# Patient Record
Sex: Female | Born: 1990 | Race: White | Hispanic: No | Marital: Married | Smoking: Never smoker
Health system: Southern US, Community
[De-identification: ages and names within clinical notes are randomized; demographics above are authoritative.]

---

## 2020-09-22 ENCOUNTER — Emergency Department
Admission: EM | Admit: 2020-09-22 | Discharge: 2020-09-22 | Disposition: A | Discharge status: Home / Self Care | Payer: BC Managed Care – PPO | Source: Home / Self Care

## 2020-09-22 ENCOUNTER — Ambulatory Visit: Payer: Self-pay

## 2020-09-22 ENCOUNTER — Emergency Department (INDEPENDENT_AMBULATORY_CARE_PROVIDER_SITE_OTHER): Payer: BC Managed Care – PPO

## 2020-09-22 ENCOUNTER — Other Ambulatory Visit: Payer: Self-pay

## 2020-09-22 ENCOUNTER — Emergency Department: Admit: 2020-09-22 | Discharge status: Absent without leave | Payer: Self-pay

## 2020-09-22 DIAGNOSIS — R05 Cough: Secondary | ICD-10-CM

## 2020-09-22 DIAGNOSIS — J209 Acute bronchitis, unspecified: Secondary | ICD-10-CM | POA: Diagnosis not present

## 2020-09-22 DIAGNOSIS — Z8616 Personal history of COVID-19: Secondary | ICD-10-CM

## 2020-09-22 MED ORDER — PREDNISONE 50 MG PO TABS: 50.0000 mg | ORAL_TABLET | Freq: Every day | ORAL | 0 refills | Status: AC

## 2020-09-22 MED ORDER — DOXYCYCLINE HYCLATE 100 MG PO CAPS: 100.0000 mg | ORAL_CAPSULE | Freq: Two times a day (BID) | ORAL | 0 refills | Status: AC

## 2020-09-22 MED ORDER — BENZONATATE 100 MG PO CAPS
100.0000 mg | ORAL_CAPSULE | Freq: Three times a day (TID) | ORAL | 0 refills | Status: AC
Start: 2020-09-22 — End: ?

## 2020-09-22 NOTE — Discharge Instructions (Addendum)

## 2020-09-22 NOTE — ED Provider Notes (Signed)
[facility]    CSN: [id number] Arrival date & time: 09/22/20  1416      History   Chief Complaint Chief Complaint  Patient presents with  . Cough  . Nasal Congestion    HPI Bonnie Vang is a 29 y.o. female.   HPI Bonnie Vang is a 29 y.o. female presenting to UC with c/o 1 week of worsening dry hacking cough that is more productive in the morning. Soreness from coughin.  She was dx with COVID at the beginning of the month after developing body aches.  She was feeling better and had no cough for about a week but then current cough started 1 week ago. She was fully vaccinated with the Pfizer vaccine for COVID.  Denies fever, chills, n/v/d. Hx of asthma. Has used her inhaler without relief.    History reviewed. No pertinent past medical history.  There are no problems to display for this patient.   History reviewed. No pertinent surgical history.  OB History   No obstetric history on file.      Home Medications    Prior to Admission medications   Medication Sig Start Date End Date Taking? Authorizing Provider  spironolactone (ALDACTONE) 25 MG tablet Take 25 mg by mouth daily.   Yes [provider]  benzonatate (TESSALON) 100 MG capsule Take 1-2 capsules (100-200 mg total) by mouth every 8 (eight) hours. 09/22/20   Lurene Shadow, PA-C  doxycycline (VIBRAMYCIN) 100 MG capsule Take 1 capsule (100 mg total) by mouth 2 (two) times daily for 7 days. 09/22/20 09/29/20  Lurene Shadow, PA-C  predniSONE (DELTASONE) 50 MG tablet Take 1 tablet (50 mg total) by mouth daily with breakfast for 5 days. 09/22/20 09/27/20  Lurene Shadow, PA-C    Family History Family History  Problem Relation Age of Onset  . Hypertension Mother   . Healthy Father     Social History Social History   Tobacco Use  . Smoking status: Never Smoker  . Smokeless tobacco: Never Used  Substance Use Topics  . Alcohol use: Yes    Alcohol/week: 1.0 standard drink    Types: 1 Standard  drinks or equivalent per week    Comment: socially  . Drug use: Not on file     Allergies   Patient has no known allergies.   Review of Systems Review of Systems  Constitutional: Positive for fatigue. Negative for chills and fever.  HENT: Positive for congestion (mild). Negative for ear pain, sore throat, trouble swallowing and voice change.   Respiratory: Positive for cough and chest tightness. Negative for shortness of breath.   Cardiovascular: Negative for chest pain and palpitations.  Gastrointestinal: Negative for abdominal pain, diarrhea, nausea and vomiting.  Musculoskeletal: Negative for arthralgias, back pain and myalgias.  Skin: Negative for rash.  All other systems reviewed and are negative.    Physical Exam Triage Vital Signs ED Triage Vitals  Enc Vitals Group     BP 09/22/20 1439 119/79     Pulse Rate 09/22/20 1439 77     Resp 09/22/20 1439 16     Temp 09/22/20 1439 97.9 F (36.6 C)     Temp Source 09/22/20 1439 Oral     SpO2 09/22/20 1439 99 %     Weight --      Height --      Head Circumference --      Peak Flow --      Pain Score 09/22/20 1437 0  Pain Loc --      Pain Edu? --      Excl. in GC? --    No data found.  Updated Vital Signs BP 119/79 (BP Location: Right Arm)   Pulse 77   Temp 97.9 F (36.6 C) (Oral)   Resp 16   SpO2 99%   Visual Acuity Right Eye Distance:   Left Eye Distance:   Bilateral Distance:    Right Eye Near:   Left Eye Near:    Bilateral Near:     Physical Exam Vitals and nursing note reviewed.  Constitutional:      General: She is not in acute distress.    Appearance: Normal appearance. She is well-developed. She is not ill-appearing, toxic-appearing or diaphoretic.  HENT:     Head: Normocephalic and atraumatic.     Right Ear: Tympanic membrane and ear canal normal.     Left Ear: Tympanic membrane and ear canal normal.     Nose: Nose normal.     Mouth/Throat:     Mouth: Mucous membranes are moist.      Pharynx: Oropharynx is clear.  Cardiovascular:     Rate and Rhythm: Normal rate and regular rhythm.  Pulmonary:     Effort: Pulmonary effort is normal. No respiratory distress.     Breath sounds: Normal breath sounds. No stridor. No wheezing, rhonchi or rales.     Comments: Dry hacking cough without respiratory distress on exam. Musculoskeletal:        General: Normal range of motion.     Cervical back: Normal range of motion and neck supple. No tenderness.  Lymphadenopathy:     Cervical: No cervical adenopathy.  Skin:    General: Skin is warm and dry.  Neurological:     Mental Status: She is alert and oriented to person, place, and time.  Psychiatric:        Behavior: Behavior normal.      UC Treatments / Results  Labs (all labs ordered are listed, but only abnormal results are displayed) Labs Reviewed - No data to display  EKG   Radiology DG Chest 2 View  Result Date: 09/22/2020 CLINICAL DATA:  Worsening cough. COVID positive in the beginning of the month. EXAM: CHEST - 2 VIEW COMPARISON:  None. FINDINGS: The cardiomediastinal contours are normal. Mild peribronchial thickening. There is no focal airspace disease. Pulmonary vasculature is normal. No consolidation, pleural effusion, or pneumothorax. No acute osseous abnormalities are seen. Slight scoliotic curvature of the lower thoracic and upper lumbar spine. IMPRESSION: Mild peribronchial thickening suggesting bronchitis or asthma. No focal airspace disease. Electronically Signed   By: Narda Rutherford M.D.   On: 09/22/2020 15:24    Procedures Procedures (including critical care time)  Medications Ordered in UC Medications - No data to display  Initial Impression / Assessment and Plan / UC Course  I have reviewed the triage vital signs and the nursing notes.  Pertinent labs & imaging results that were available during my care of the patient were reviewed by me and considered in my medical decision making (see chart for  details).    Vitals: WNL including O2 sat 99% on RA. Will cover for secondary bacterial infection post-covid infection due to improving then worsening of symptoms over the last 3 weeks. F/u with PCP as needed AVS given  Final Clinical Impressions(s) / UC Diagnoses   Final diagnoses:  Acute bronchitis, unspecified organism     Discharge Instructions      Please take  antibiotics as prescribed and be sure to complete entire course even if you start to feel better to ensure infection does not come back.  You may take 500mg  acetaminophen every 4-6 hours or in combination with ibuprofen 400-600mg  every 6-8 hours as needed for pain, inflammation, and fever.  Be sure to well hydrated with clear liquids and get at least 8 hours of sleep at night, preferably more while sick.   Please follow up with family medicine in 1 week if needed.     ED Prescriptions    Medication Sig Dispense Auth. Provider   doxycycline (VIBRAMYCIN) 100 MG capsule Take 1 capsule (100 mg total) by mouth 2 (two) times daily for 7 days. 14 capsule , Chayse Gracey O, PA-C   predniSONE (DELTASONE) 50 MG tablet Take 1 tablet (50 mg total) by mouth daily with breakfast for 5 days. 5 tablet Doroteo Glassman, [place] Wert O, PA-C   benzonatate (TESSALON) 100 MG capsule Take 1-2 capsules (100-200 mg total) by mouth every 8 (eight) hours. 21 capsule [birth year]1987, Lurene Shadow     PDMP not reviewed this encounter.   [place], Lurene Shadow 09/22/20 1553

## 2020-09-22 NOTE — ED Triage Notes (Signed)
Patient presents to Urgent Care with complaints of dry cough, headache, nasal congestion since a few days ago. Patient reports it is difficult to sleep with so much coughing. Pt did have covid at the beginning of the month, has also been vaccinated.

## 2022-02-20 IMAGING — DX DG CHEST 2V
2 series · 2 of 2 positions shown · non-contrast
Comparison: None.

CLINICAL DATA: Worsening cough. COVID positive in the beginning of
the month.

EXAM:
CHEST - 2 VIEW

[chest pa]
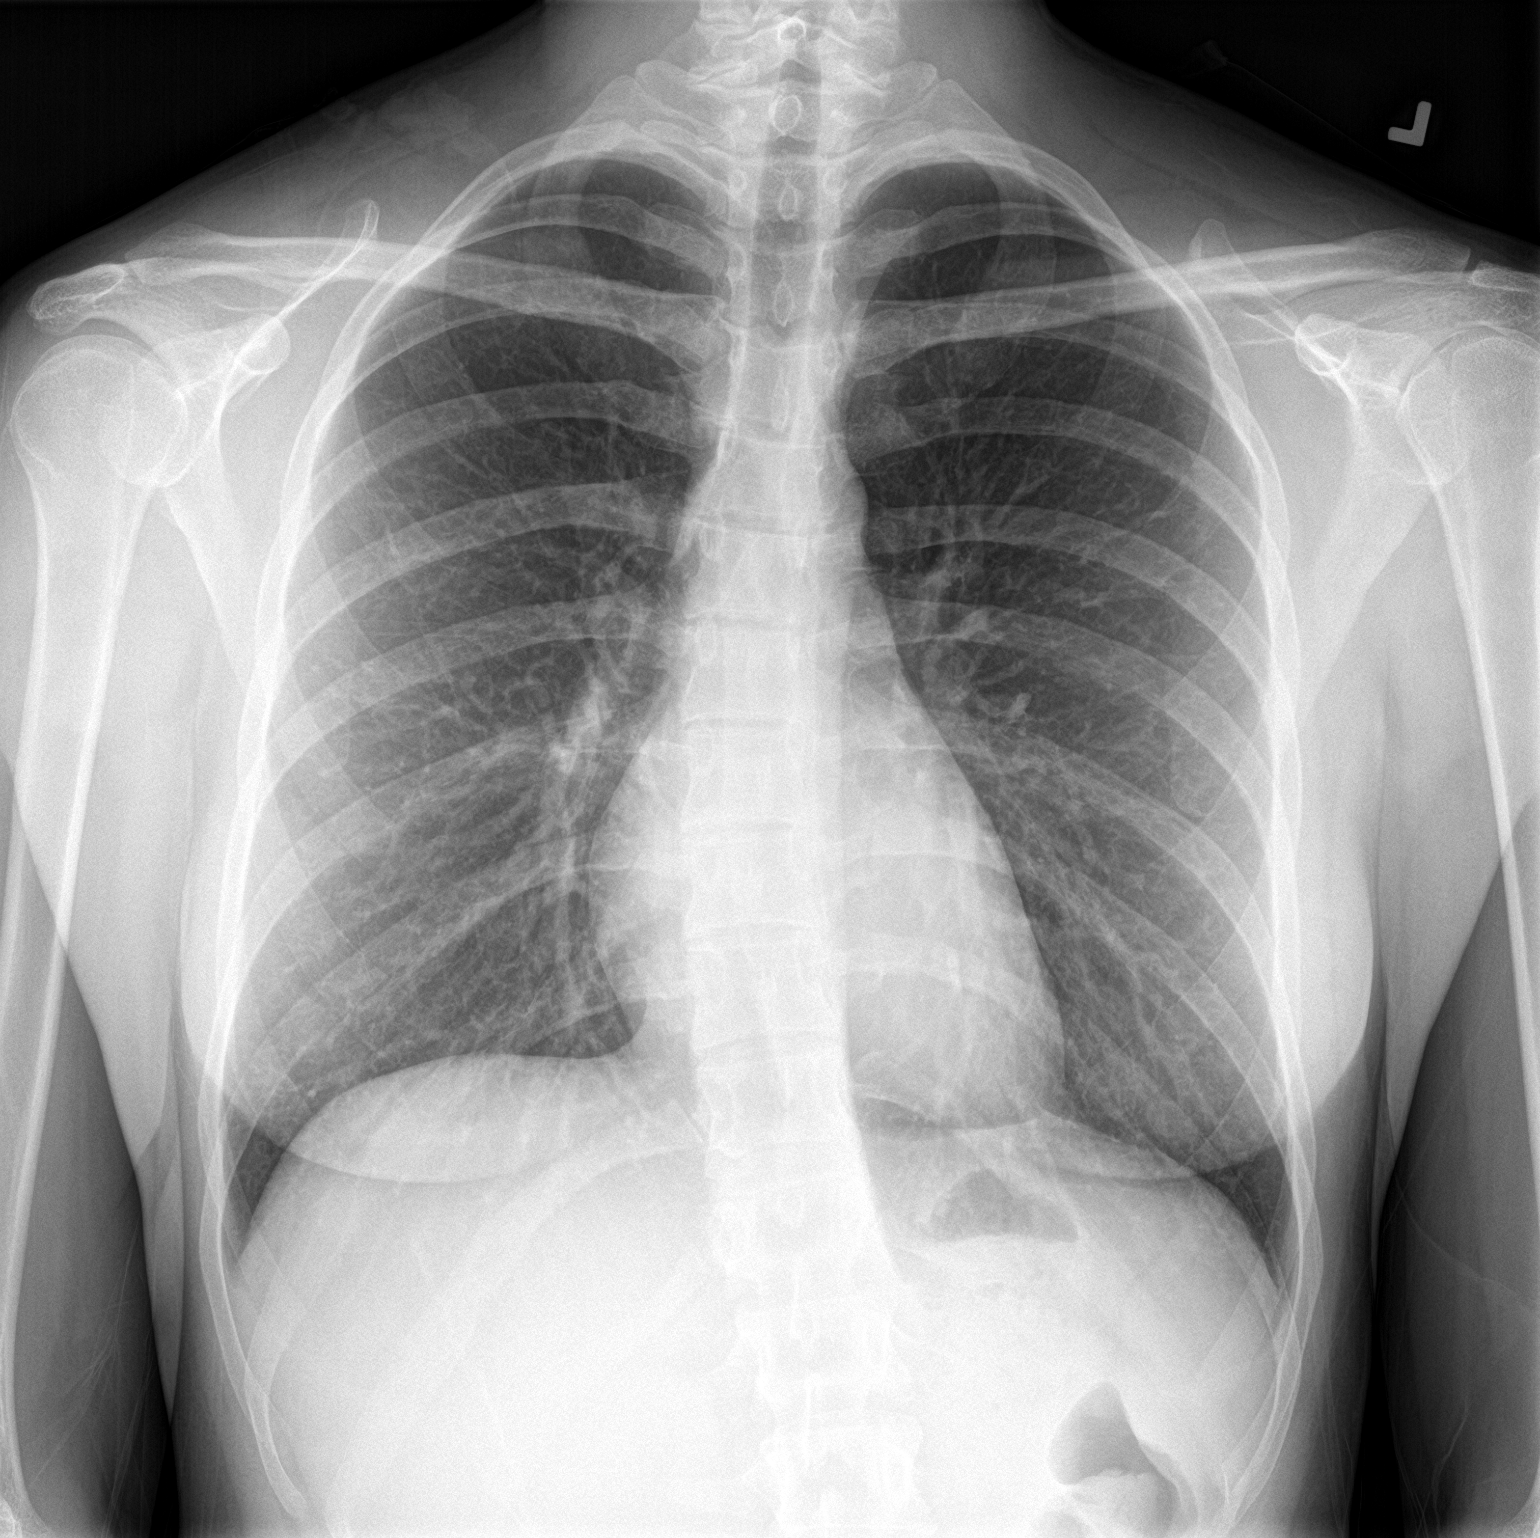

[chest lat]
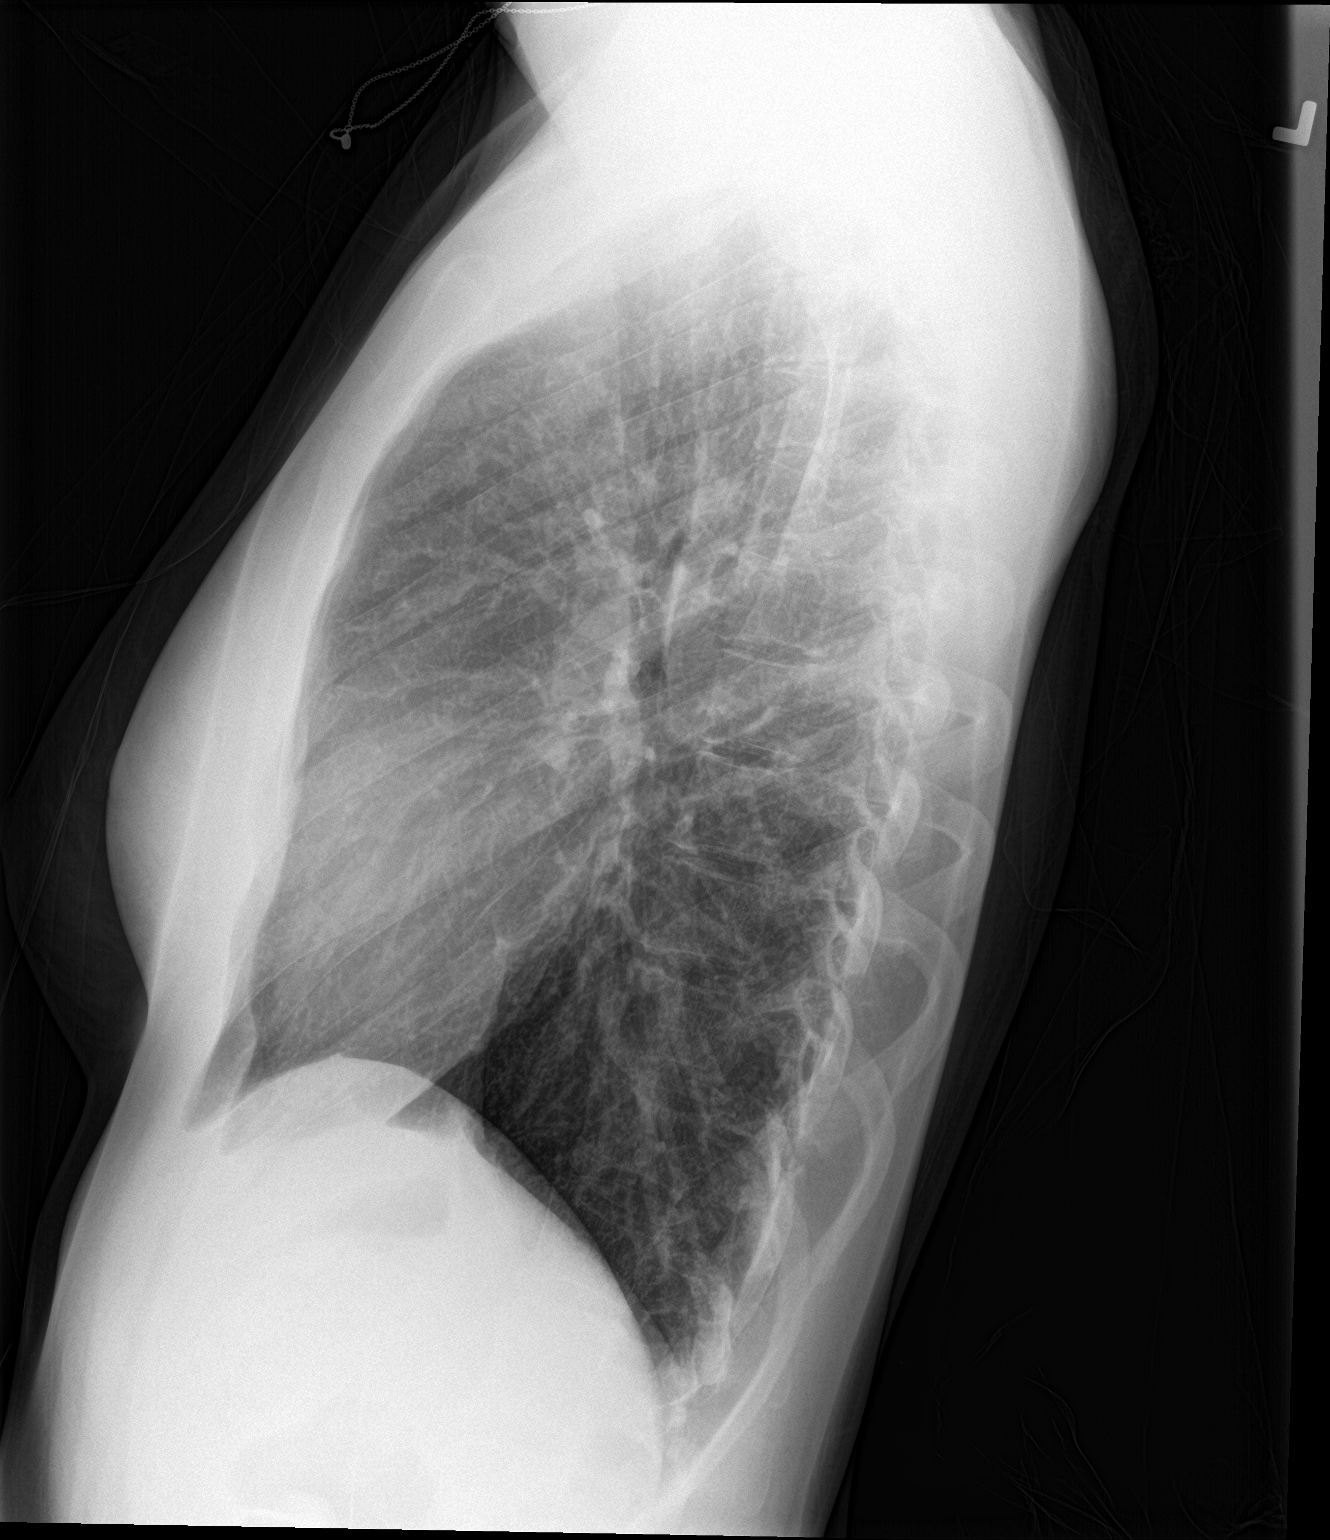

[2 of 2 positions shown; findings below may reference images not displayed]

FINDINGS: The cardiomediastinal contours are normal. Mild peribronchial
thickening. There is no focal airspace disease. Pulmonary
vasculature is normal. No consolidation, pleural effusion, or
pneumothorax. No acute osseous abnormalities are seen. Slight
scoliotic curvature of the lower thoracic and upper lumbar spine.
IMPRESSION: Mild peribronchial thickening suggesting bronchitis or asthma. No
focal airspace disease.
